# Patient Record
Sex: Female | Born: 1955 | Race: White | Hispanic: No | Marital: Married | Smoking: Never smoker
Health system: Southern US, Community
[De-identification: ages and names within clinical notes are randomized; demographics above are authoritative.]

## PROBLEM LIST (undated history)

## (undated) DIAGNOSIS — L509 Urticaria, unspecified: Secondary | ICD-10-CM

## (undated) HISTORY — PX: TUBAL LIGATION: SHX77

## (undated) HISTORY — DX: Urticaria, unspecified: L50.9

---

## 2013-12-10 DIAGNOSIS — E785 Hyperlipidemia, unspecified: Secondary | ICD-10-CM | POA: Insufficient documentation

## 2013-12-10 DIAGNOSIS — D126 Benign neoplasm of colon, unspecified: Secondary | ICD-10-CM | POA: Insufficient documentation

## 2013-12-10 DIAGNOSIS — B070 Plantar wart: Secondary | ICD-10-CM | POA: Insufficient documentation

## 2013-12-10 DIAGNOSIS — K824 Cholesterolosis of gallbladder: Secondary | ICD-10-CM | POA: Insufficient documentation

## 2013-12-10 DIAGNOSIS — M79641 Pain in right hand: Secondary | ICD-10-CM | POA: Insufficient documentation

## 2013-12-10 DIAGNOSIS — M779 Enthesopathy, unspecified: Secondary | ICD-10-CM | POA: Insufficient documentation

## 2013-12-10 DIAGNOSIS — R9389 Abnormal findings on diagnostic imaging of other specified body structures: Secondary | ICD-10-CM | POA: Insufficient documentation

## 2013-12-10 DIAGNOSIS — M47812 Spondylosis without myelopathy or radiculopathy, cervical region: Secondary | ICD-10-CM | POA: Insufficient documentation

## 2013-12-10 DIAGNOSIS — K219 Gastro-esophageal reflux disease without esophagitis: Secondary | ICD-10-CM | POA: Insufficient documentation

## 2013-12-10 DIAGNOSIS — R7309 Other abnormal glucose: Secondary | ICD-10-CM | POA: Insufficient documentation

## 2013-12-10 DIAGNOSIS — R739 Hyperglycemia, unspecified: Secondary | ICD-10-CM | POA: Insufficient documentation

## 2013-12-10 DIAGNOSIS — G4700 Insomnia, unspecified: Secondary | ICD-10-CM | POA: Insufficient documentation

## 2013-12-10 DIAGNOSIS — IMO0001 Reserved for inherently not codable concepts without codable children: Secondary | ICD-10-CM | POA: Insufficient documentation

## 2013-12-10 DIAGNOSIS — I10 Essential (primary) hypertension: Secondary | ICD-10-CM | POA: Insufficient documentation

## 2013-12-10 DIAGNOSIS — R202 Paresthesia of skin: Secondary | ICD-10-CM | POA: Insufficient documentation

## 2013-12-10 DIAGNOSIS — M79642 Pain in left hand: Secondary | ICD-10-CM

## 2013-12-10 DIAGNOSIS — B009 Herpesviral infection, unspecified: Secondary | ICD-10-CM | POA: Insufficient documentation

## 2013-12-10 DIAGNOSIS — N62 Hypertrophy of breast: Secondary | ICD-10-CM | POA: Insufficient documentation

## 2013-12-10 DIAGNOSIS — Z9889 Other specified postprocedural states: Secondary | ICD-10-CM | POA: Insufficient documentation

## 2013-12-10 DIAGNOSIS — M2020 Hallux rigidus, unspecified foot: Secondary | ICD-10-CM | POA: Insufficient documentation

## 2013-12-10 DIAGNOSIS — E559 Vitamin D deficiency, unspecified: Secondary | ICD-10-CM | POA: Insufficient documentation

## 2013-12-10 DIAGNOSIS — K589 Irritable bowel syndrome without diarrhea: Secondary | ICD-10-CM | POA: Insufficient documentation

## 2014-08-01 ENCOUNTER — Encounter: Payer: Self-pay | Admitting: Sports Medicine

## 2014-08-01 ENCOUNTER — Ambulatory Visit (INDEPENDENT_AMBULATORY_CARE_PROVIDER_SITE_OTHER): Payer: BLUE CROSS/BLUE SHIELD | Admitting: Sports Medicine

## 2014-08-01 ENCOUNTER — Ambulatory Visit (INDEPENDENT_AMBULATORY_CARE_PROVIDER_SITE_OTHER): Payer: BLUE CROSS/BLUE SHIELD

## 2014-08-01 VITALS — BP 120/73 | HR 65 | Ht 62.0 in | Wt 157.0 lb

## 2014-08-01 DIAGNOSIS — M2240 Chondromalacia patellae, unspecified knee: Secondary | ICD-10-CM | POA: Diagnosis not present

## 2014-08-01 DIAGNOSIS — K760 Fatty (change of) liver, not elsewhere classified: Secondary | ICD-10-CM | POA: Insufficient documentation

## 2014-08-01 DIAGNOSIS — M1990 Unspecified osteoarthritis, unspecified site: Secondary | ICD-10-CM | POA: Diagnosis not present

## 2014-08-01 DIAGNOSIS — M19041 Primary osteoarthritis, right hand: Secondary | ICD-10-CM

## 2014-08-01 DIAGNOSIS — M19042 Primary osteoarthritis, left hand: Secondary | ICD-10-CM

## 2014-08-01 DIAGNOSIS — M7732 Calcaneal spur, left foot: Secondary | ICD-10-CM

## 2014-08-01 DIAGNOSIS — M5136 Other intervertebral disc degeneration, lumbar region: Secondary | ICD-10-CM | POA: Diagnosis not present

## 2014-08-01 DIAGNOSIS — M7731 Calcaneal spur, right foot: Secondary | ICD-10-CM

## 2014-08-01 DIAGNOSIS — M19049 Primary osteoarthritis, unspecified hand: Secondary | ICD-10-CM | POA: Insufficient documentation

## 2014-08-01 DIAGNOSIS — M2021 Hallux rigidus, right foot: Secondary | ICD-10-CM | POA: Insufficient documentation

## 2014-08-01 DIAGNOSIS — M51369 Other intervertebral disc degeneration, lumbar region without mention of lumbar back pain or lower extremity pain: Secondary | ICD-10-CM | POA: Insufficient documentation

## 2014-08-01 MED ORDER — IBUPROFEN-FAMOTIDINE 800-26.6 MG PO TABS: 1.0000 | ORAL_TABLET | Freq: Three times a day (TID) | ORAL | Status: DC

## 2014-08-01 NOTE — Assessment & Plan Note (Signed)
X rays

## 2014-08-01 NOTE — Assessment & Plan Note (Signed)
With patellofemoral crepitus. Formal physical therapy, x-rays. Duexis.  Return in a month. Interventional treatment if no better.

## 2014-08-01 NOTE — Assessment & Plan Note (Signed)
Symptoms are suspicious for lumbar degenerative disc disease with pain on Valsalva. Pain is all axial, nothing radicular, and also worse with standing. At this point we are going to do ibuprofen/famotidine, and I would like her to do some formal physical therapy as well. At her return visit if no better we will consider an x-ray, and subsequently possibly an MRI for interventional planning.

## 2014-08-01 NOTE — Assessment & Plan Note (Signed)
With what appears to be fusion of the right first metatarsophalangeal joint, with mild limitus of the left first metatarsal-phalangeal joint. Custom orthotics as above.

## 2014-08-01 NOTE — Progress Notes (Signed)
   Subjective:    I'm seeing this patient as a consultation for:  Dr. Ruben Gottron  CC: Polyarthralgia  HPI: This is a pleasant 59 year old female who comes in with joint pains at multiple sites, she does desire custom orthotics.  Foot pain: Bilateral, localized in the arches, worse at the MTP, she has very little movement in the right first and moderate movement in the left first MTP. Symptoms are moderate, persistent.  Low back pain: Worse with sneezing, moderate, persistent without radiation, no bowel or bladder dysfunction or saddle numbness.  Bilateral hand pain: Localize at the thumb basal joint bilaterally. Has not been using NSAIDs due to epigastric pain.  Bilateral knee pain: Localize anteriorly, moderate, persistent without radiation, worse with squatting and going up and down stairs.  Past medical history, Surgical history, Family history not pertinant except as noted below, Social history, Allergies, and medications have been entered into the medical record, reviewed, and no changes needed.   Review of Systems: No headache, visual changes, nausea, vomiting, diarrhea, constipation, dizziness, abdominal pain, skin rash, fevers, chills, night sweats, weight loss, swollen lymph nodes, body aches, joint swelling, muscle aches, chest pain, shortness of breath, mood changes, visual or auditory hallucinations.   Objective:   General: Well Developed, well nourished, and in no acute distress.  Neuro/Psych: Alert and oriented x3, extra-ocular muscles intact, able to move all 4 extremities, sensation grossly intact. Skin: Warm and dry, no rashes noted.  Respiratory: Not using accessory muscles, speaking in full sentences, trachea midline.  Cardiovascular: Pulses palpable, no extremity edema. Abdomen: Does not appear distended. Bilateral Knee: Normal to inspection with no erythema or effusion or obvious bony abnormalities. Tender to palpation at the patellar facets, with painful patellar  compression. ROM normal in flexion and extension and lower leg rotation. Ligaments with solid consistent endpoints including ACL, PCL, LCL, MCL. Negative Mcmurray's and provocative meniscal tests. Bilateral feet: No visible erythema or swelling. Range of motion is full in all directions. Strength is 5/5 in all directions. No hallux valgus. No pes cavus or pes planus. No abnormal callus noted. No pain over the navicular prominence, or base of fifth metatarsal. No tenderness to palpation of the calcaneal insertion of plantar fascia. No pain at the Achilles insertion. No pain over the calcaneal bursa. No pain of the retrocalcaneal bursa. No tenderness to palpation over the tarsals, metatarsals, or phalanges. Right foot seems to be fused at the first metatarsophalangeal joint, left side is somewhat swollen but with good motion. No tenderness palpation over interphalangeal joints. No pain with compression of the metatarsal heads. Neurovascularly intact distally. Patellar and quadriceps tendons unremarkable. Hamstring and quadriceps strength is normal.  Patient was fitted for a : standard, cushioned, semi-rigid orthotic. The orthotic was heated and afterward the patient stood on the orthotic blank positioned on the orthotic stand. The patient was positioned in subtalar neutral position and 10 degrees of ankle dorsiflexion in a weight bearing stance. After completion of molding, a stable base was applied to the orthotic blank. The blank was ground to a stable position for weight bearing. Size: 6 Base: [employer] safety [employer] and Padding: None The patient ambulated these, and they were very comfortable.  Impression and Recommendations:   This case required medical decision making of moderate complexity.

## 2014-08-30 ENCOUNTER — Ambulatory Visit: Payer: BLUE CROSS/BLUE SHIELD | Admitting: Sports Medicine

## 2014-09-05 ENCOUNTER — Ambulatory Visit (INDEPENDENT_AMBULATORY_CARE_PROVIDER_SITE_OTHER): Payer: BLUE CROSS/BLUE SHIELD | Admitting: Sports Medicine

## 2014-09-05 ENCOUNTER — Encounter: Payer: Self-pay | Admitting: Sports Medicine

## 2014-09-05 VITALS — BP 127/81 | HR 66 | Wt 156.0 lb

## 2014-09-05 DIAGNOSIS — M5136 Other intervertebral disc degeneration, lumbar region: Secondary | ICD-10-CM | POA: Diagnosis not present

## 2014-09-05 DIAGNOSIS — M2241 Chondromalacia patellae, right knee: Secondary | ICD-10-CM | POA: Diagnosis not present

## 2014-09-05 DIAGNOSIS — M51369 Other intervertebral disc degeneration, lumbar region without mention of lumbar back pain or lower extremity pain: Secondary | ICD-10-CM

## 2014-09-05 NOTE — Assessment & Plan Note (Signed)
Resolved with formal physical therapy.

## 2014-09-05 NOTE — Assessment & Plan Note (Signed)
Injection as above, transition to patellar stabilizing orthosis. Continue therapy, has only had 2 sessions. Return in one month.

## 2014-09-05 NOTE — Progress Notes (Signed)
  Subjective:    CC: Follow-up  HPI: Lumbar degenerative disc disease with spondylolisthesis: Resolved with physical therapy.  Patellofemoral chondromalacia: Right-sided, persistent despite therapy, pain is moderate, persistent localized under the kneecap.  Past medical history, Surgical history, Family history not pertinant except as noted below, Social history, Allergies, and medications have been entered into the medical record, reviewed, and no changes needed.   Review of Systems: No fevers, chills, night sweats, weight loss, chest pain, or shortness of breath.   Objective:    General: Well Developed, well nourished, and in no acute distress.  Neuro: Alert and oriented x3, extra-ocular muscles intact, sensation grossly intact.  HEENT: Normocephalic, atraumatic, pupils equal round reactive to light, neck supple, no masses, no lymphadenopathy, thyroid nonpalpable.  Skin: Warm and dry, no rashes. Cardiac: Regular rate and rhythm, no murmurs rubs or gallops, no lower extremity edema.  Respiratory: Clear to auscultation bilaterally. Not using accessory muscles, speaking in full sentences. Right Knee: Normal to inspection with no erythema or effusion or obvious bony abnormalities. Palpation normal with no warmth or joint line tenderness or patellar tenderness or condyle tenderness. ROM normal in flexion and extension and lower leg rotation. Ligaments with solid consistent endpoints including ACL, PCL, LCL, MCL. Negative Mcmurray's and provocative meniscal tests. Painful patellar compression. Positive apprehension sign, patellar. Patellar and quadriceps tendons unremarkable. Hamstring and quadriceps strength is normal.  Procedure: Real-time Ultrasound Guided Injection of right knee Device: GE Logiq E  Verbal informed consent obtained.  Time-out conducted.  Noted no overlying erythema, induration, or other signs of local infection.  Skin prepped in a sterile fashion.  Local  anesthesia: Topical Ethyl chloride.  With sterile technique and under real time ultrasound guidance:  2 mL Kenalog 40, 4 mL lidocaine injected easily. Completed without difficulty  Pain immediately resolved suggesting accurate placement of the medication.  Advised to call if fevers/chills, erythema, induration, drainage, or persistent bleeding.  Images permanently stored and available for review in the ultrasound unit.  Impression: Technically successful ultrasound guided injection.  Impression and Recommendations:

## 2014-09-28 DIAGNOSIS — M545 Low back pain, unspecified: Secondary | ICD-10-CM | POA: Insufficient documentation

## 2014-09-28 DIAGNOSIS — Z79899 Other long term (current) drug therapy: Secondary | ICD-10-CM | POA: Insufficient documentation

## 2014-09-28 DIAGNOSIS — B372 Candidiasis of skin and nail: Secondary | ICD-10-CM | POA: Insufficient documentation

## 2014-09-28 DIAGNOSIS — Z0000 Encounter for general adult medical examination without abnormal findings: Secondary | ICD-10-CM | POA: Insufficient documentation

## 2014-10-04 ENCOUNTER — Ambulatory Visit (INDEPENDENT_AMBULATORY_CARE_PROVIDER_SITE_OTHER): Payer: BLUE CROSS/BLUE SHIELD | Admitting: Sports Medicine

## 2014-10-04 ENCOUNTER — Encounter: Payer: Self-pay | Admitting: Sports Medicine

## 2014-10-04 VITALS — BP 117/77 | HR 76 | Ht 62.0 in | Wt 155.0 lb

## 2014-10-04 DIAGNOSIS — M2241 Chondromalacia patellae, right knee: Secondary | ICD-10-CM

## 2014-10-04 NOTE — Assessment & Plan Note (Signed)
Doing well with regards to patellofemoral chondromalacia after injection. Will continue home rehabilitation exercises. I did plan out the medial aspect of the right orthotic, there was some excessive supination. Gait is neutral after modification. Return to see me in an as-needed basis, we will touch base on mychart with regards to the small modification in the orthotic. Jennifer Bryan will try the orthotics in her other shoes which seem to be more of a motion control shoe.

## 2014-10-04 NOTE — Progress Notes (Signed)
  Subjective:    CC: Follow-up  HPI: Jennifer Bryan returns, she is doing very well with regards to her right patellofemoral chondromalacia, she is doing well with the orthotics however does feel as though she is excessively supinating. Symptoms are mild. No mechanical symptoms.  Past medical history, Surgical history, Family history not pertinant except as noted below, Social history, Allergies, and medications have been entered into the medical record, reviewed, and no changes needed.   Review of Systems: No fevers, chills, night sweats, weight loss, chest pain, or shortness of breath.   Objective:    General: Well Developed, well nourished, and in no acute distress.  Neuro: Alert and oriented x3, extra-ocular muscles intact, sensation grossly intact.  HEENT: Normocephalic, atraumatic, pupils equal round reactive to light, neck supple, no masses, no lymphadenopathy, thyroid nonpalpable.  Skin: Warm and dry, no rashes. Cardiac: Regular rate and rhythm, no murmurs rubs or gallops, no lower extremity edema.  Respiratory: Clear to auscultation bilaterally. Not using accessory muscles, speaking in full sentences.  During gait analysis it didn't appear as though she remained excessive supination in the right foot. I planed out the medial aspect of her right orthotic that improved her motion.  Impression and Recommendations:

## 2015-07-31 IMAGING — DX DG LUMBAR SPINE COMPLETE 4+V
5 series · 5 of 5 positions shown · non-contrast
Comparison: None.

CLINICAL DATA: Chronic mid and lower back pain without known injury

EXAM:
LUMBAR SPINE - COMPLETE 4+ VIEW

[l-spine ap]
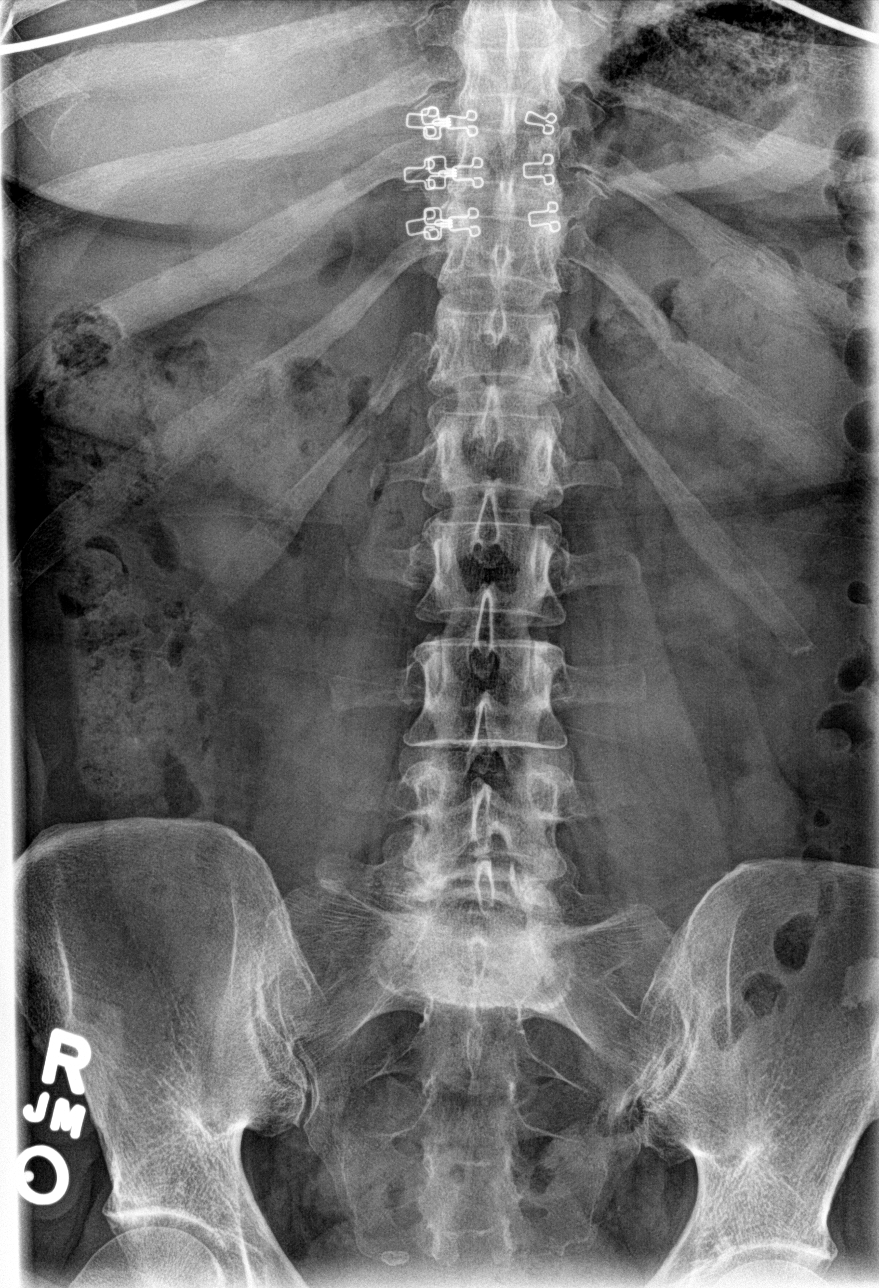

[l-spine obl (1 of 2)]
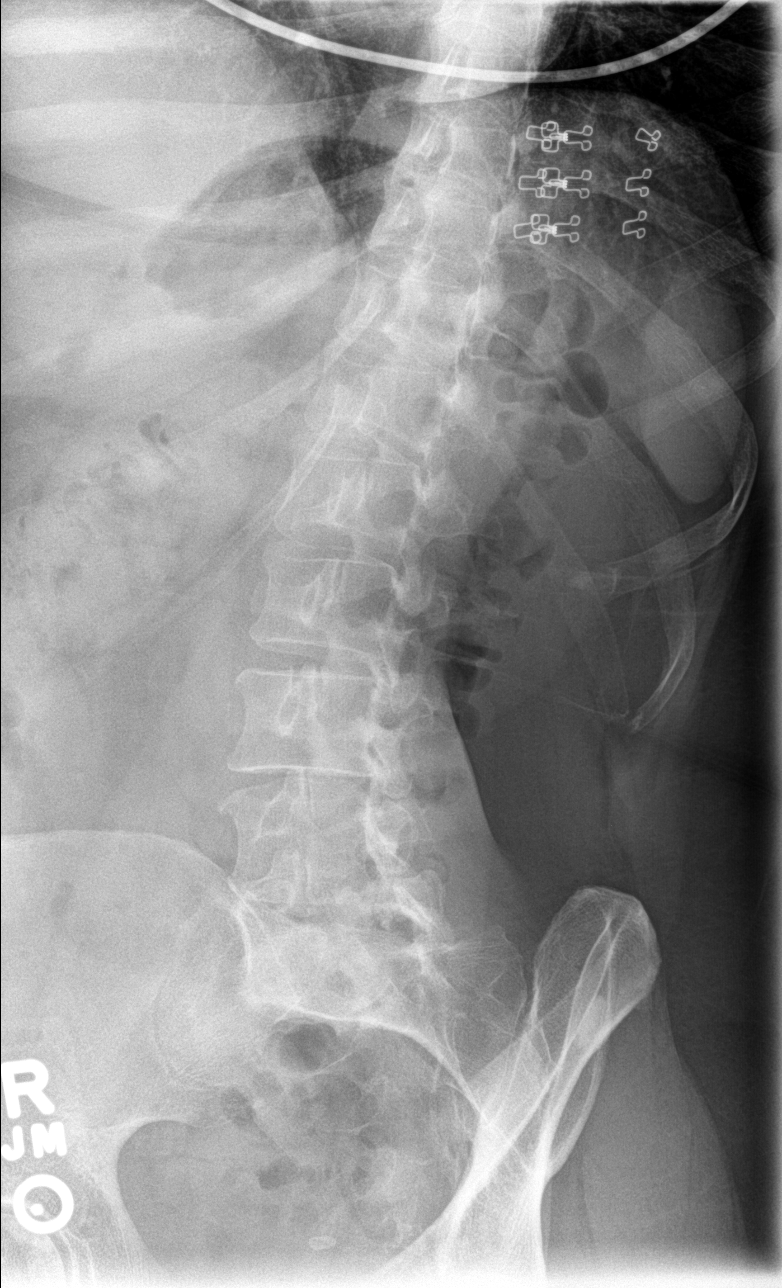

[l-spine obl (2 of 2)]
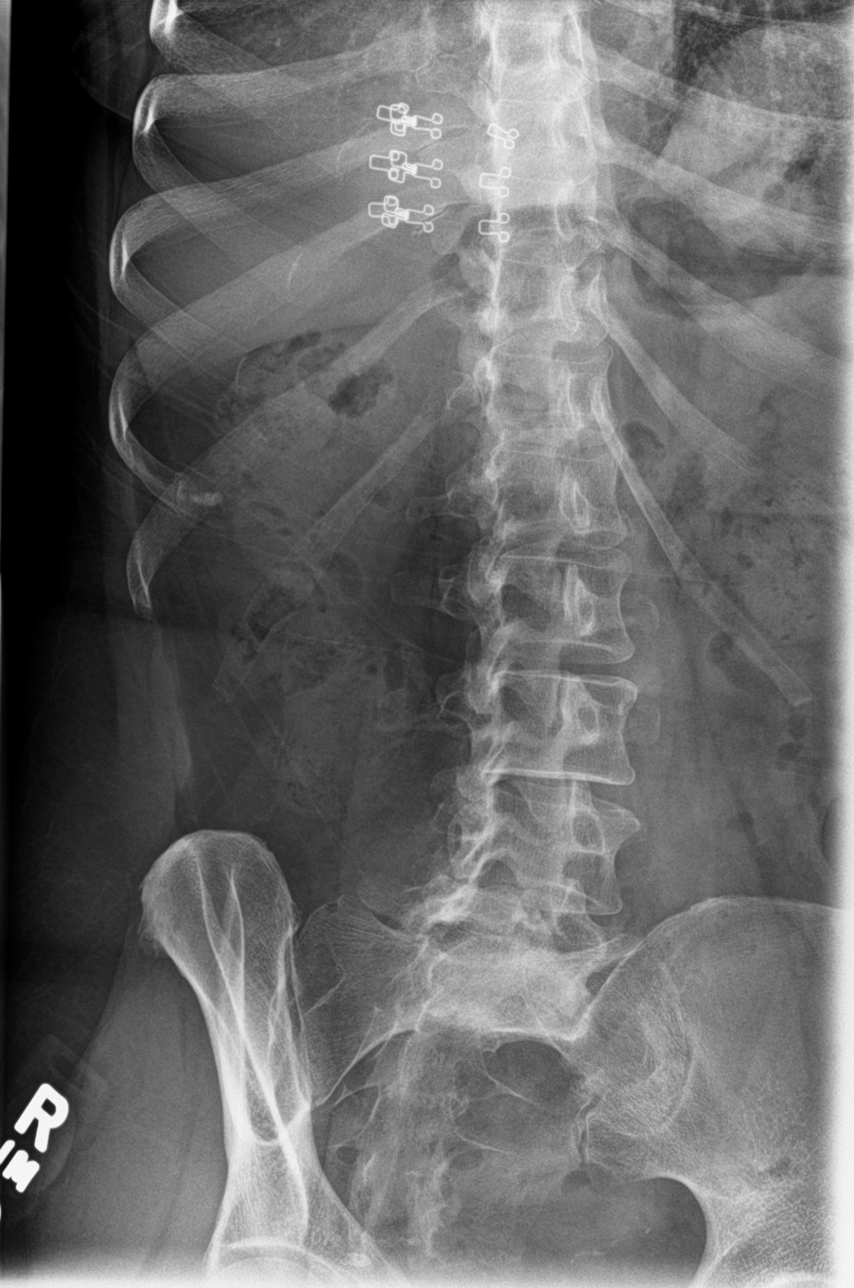

[l-spine lat]
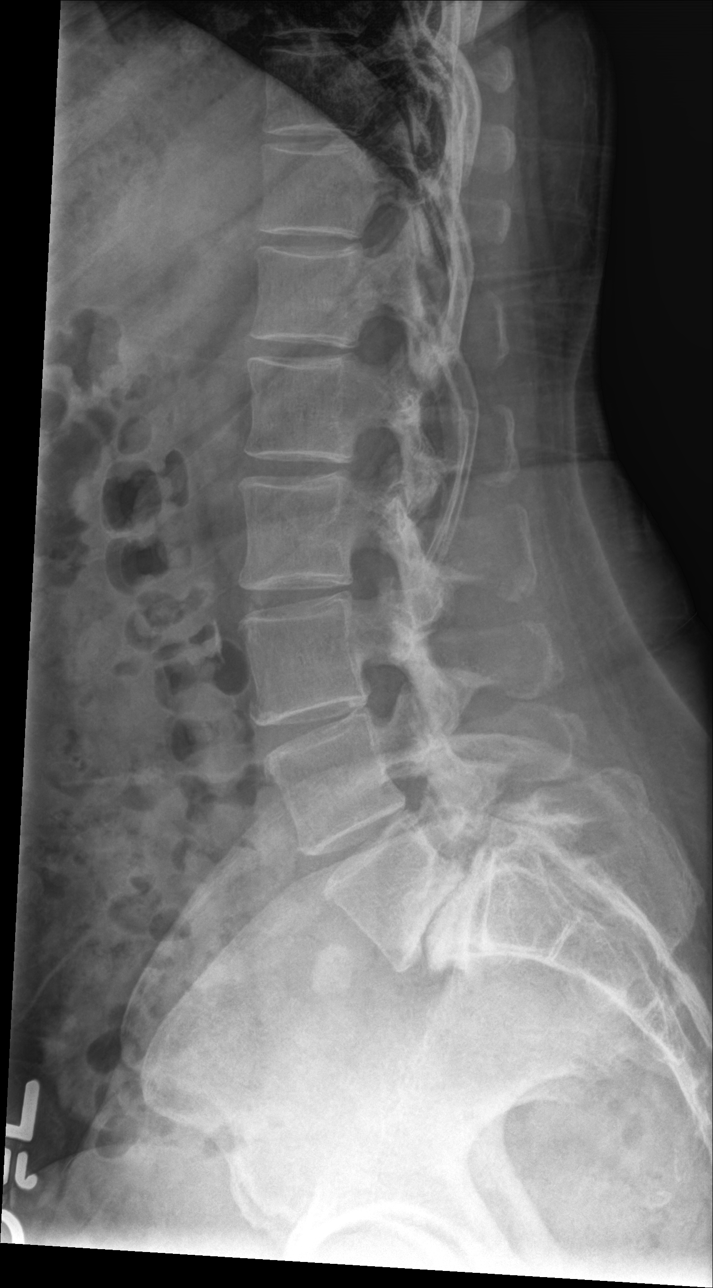

[l-spine lat l5-s1]
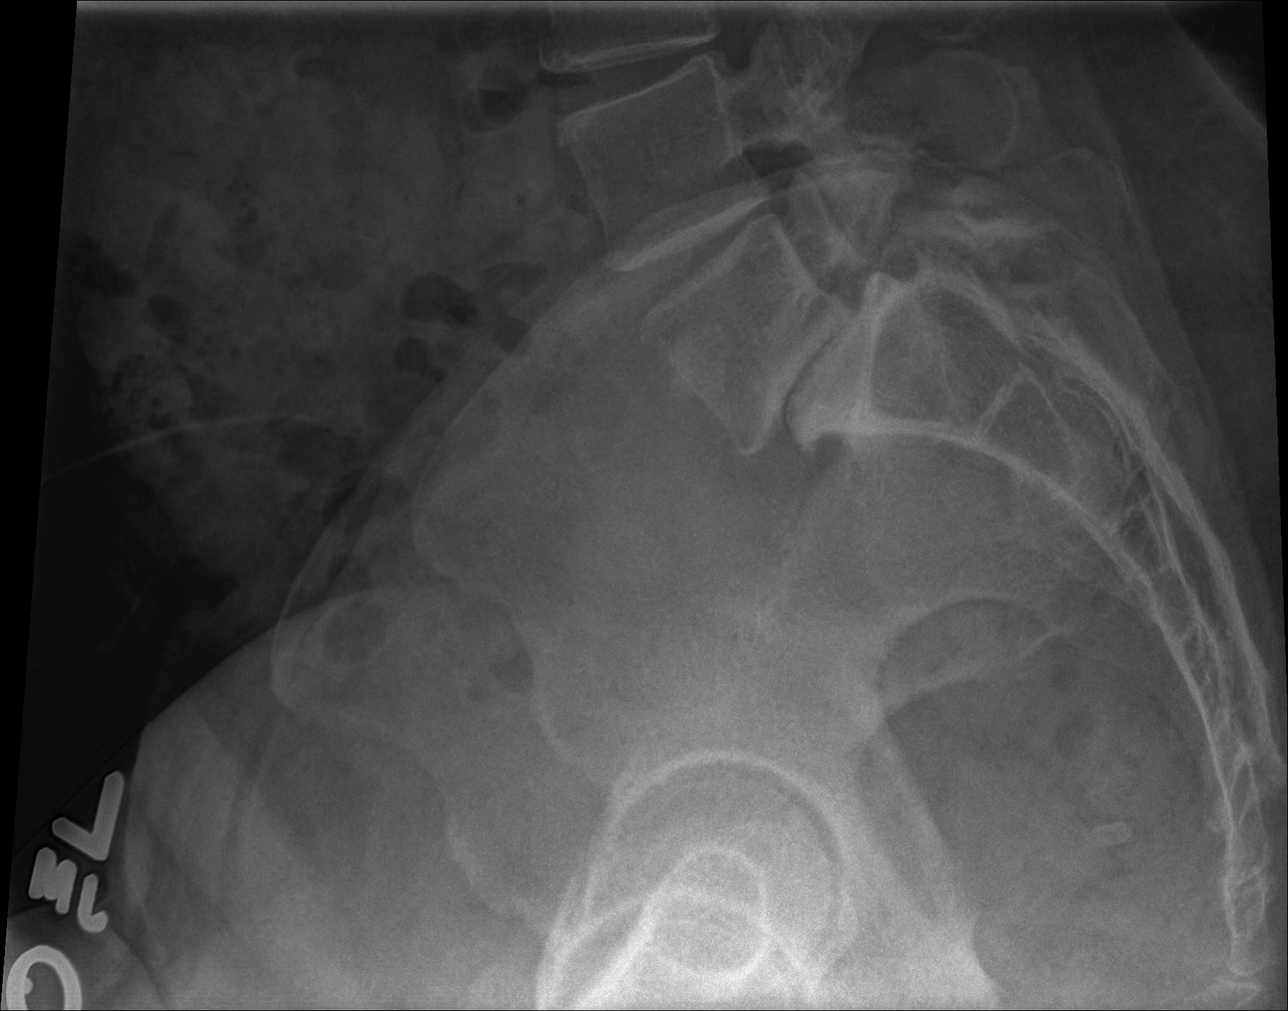

[5 of 5 positions shown; findings below may reference images not displayed]

FINDINGS: The lumbar vertebral bodies are preserved in height. There is grade
1 anterolisthesis of L5 with respect S1 which appears to be on the
basis of degenerative disc disease and probable bilateral pars
defects. There is mild disc space narrowing at L4-5 with moderate
narrowing at L5-S1. The pedicles and transverse processes are
intact. The observed portions of the sacrum are normal.
IMPRESSION: Grade 1 anterolisthesis of L5 with respect S1 is likely secondary to
bilateral pars defects as well moderate to severe degenerative disc
change. There is milder degenerative disc disease at L4-5. There is
no compression fracture.

## 2015-10-05 DIAGNOSIS — K635 Polyp of colon: Secondary | ICD-10-CM | POA: Insufficient documentation

## 2015-10-05 DIAGNOSIS — Z1159 Encounter for screening for other viral diseases: Secondary | ICD-10-CM | POA: Insufficient documentation

## 2016-06-10 DIAGNOSIS — J690 Pneumonitis due to inhalation of food and vomit: Secondary | ICD-10-CM | POA: Insufficient documentation

## 2016-07-03 DIAGNOSIS — R1313 Dysphagia, pharyngeal phase: Secondary | ICD-10-CM | POA: Insufficient documentation

## 2016-08-29 ENCOUNTER — Ambulatory Visit (INDEPENDENT_AMBULATORY_CARE_PROVIDER_SITE_OTHER): Payer: BLUE CROSS/BLUE SHIELD | Admitting: Sports Medicine

## 2016-08-29 ENCOUNTER — Ambulatory Visit (INDEPENDENT_AMBULATORY_CARE_PROVIDER_SITE_OTHER): Payer: BLUE CROSS/BLUE SHIELD

## 2016-08-29 DIAGNOSIS — M19072 Primary osteoarthritis, left ankle and foot: Secondary | ICD-10-CM | POA: Diagnosis not present

## 2016-08-29 DIAGNOSIS — M19071 Primary osteoarthritis, right ankle and foot: Secondary | ICD-10-CM | POA: Diagnosis not present

## 2016-08-29 DIAGNOSIS — M79672 Pain in left foot: Secondary | ICD-10-CM

## 2016-08-29 MED ORDER — ACETAMINOPHEN ER 650 MG PO TBCR: 650.0000 mg | EXTENDED_RELEASE_TABLET | Freq: Three times a day (TID) | ORAL | 3 refills | Status: DC | PRN

## 2016-08-29 NOTE — Progress Notes (Signed)
   Subjective:    I'm seeing this patient as a consultation for:  Dr. Rolan Lipa  CC: Left foot pain  HPI: For a couple of months this pleasant 60 year old female has had pain that she localizes medially along the arch of her left foot, worse with prolonged weightbearing. Pain is moderate, persistent without radiation. She has known osteoarthritis at the MTPs, overall this is doing well. She is developing some hallux valgus in addition to hallux rigidus, and this is causing some callus formation between the first and second toes. Unfortunately she lost the custom orthotics that I made her years ago.  Past medical history:  Negative.  See flowsheet/record as well for more information.  Surgical history: Negative.  See flowsheet/record as well for more information.  Family history: Negative.  See flowsheet/record as well for more information.  Social history: Negative.  See flowsheet/record as well for more information.  Allergies, and medications have been entered into the medical record, reviewed, and no changes needed.   Review of Systems: No headache, visual changes, nausea, vomiting, diarrhea, constipation, dizziness, abdominal pain, skin rash, fevers, chills, night sweats, weight loss, swollen lymph nodes, body aches, joint swelling, muscle aches, chest pain, shortness of breath, mood changes, visual or auditory hallucinations.   Objective:   General: Well Developed, well nourished, and in no acute distress.  Neuro/Psych: Alert and oriented x3, extra-ocular muscles intact, able to move all 4 extremities, sensation grossly intact. Skin: Warm and dry, no rashes noted.  Respiratory: Not using accessory muscles, speaking in full sentences, trachea midline.  Cardiovascular: Pulses palpable, no extremity edema. Abdomen: Does not appear distended. Left Foot: No visible erythema or swelling. Range of motion is full in all directions. Strength is 5/5 in all directions. No hallux  valgus. No pes cavus or pes planus. No abnormal callus noted. No pain over the navicular prominence, or base of fifth metatarsal. No tenderness to palpation of the calcaneal insertion of plantar fascia. No pain at the Achilles insertion. No pain over the calcaneal bursa. No pain of the retrocalcaneal bursa. Tender to palpation over the navicular, no tenderness over the tibialis posterior, this is consistent with the navicular stress injury. No hallux rigidus or limitus. No tenderness palpation over interphalangeal joints. No pain with compression of the metatarsal heads. Neurovascularly intact distally.  Impression and Recommendations:   This case required medical decision making of moderate complexity.  Left foot pain Is likely related to a navicular stress injury. Getting updated x-rays, added a scaphoid pad. She is unable to find her orthotics so she will come back for a new set of custom orthotics. Switching to Tylenol, she is getting some gastritis.

## 2016-08-29 NOTE — Assessment & Plan Note (Signed)
Is likely related to a navicular stress injury. Getting updated x-rays, added a scaphoid pad. She is unable to find her orthotics so she will come back for a new set of custom orthotics. Switching to Tylenol, she is getting some gastritis.

## 2016-09-03 ENCOUNTER — Ambulatory Visit (INDEPENDENT_AMBULATORY_CARE_PROVIDER_SITE_OTHER): Payer: BLUE CROSS/BLUE SHIELD | Admitting: Sports Medicine

## 2016-09-03 DIAGNOSIS — M79672 Pain in left foot: Secondary | ICD-10-CM | POA: Diagnosis not present

## 2016-09-03 NOTE — Assessment & Plan Note (Signed)
Navicular stress injury is improving, custom orthotics as above.

## 2016-09-03 NOTE — Progress Notes (Signed)

## 2017-08-01 DIAGNOSIS — Z86010 Personal history of colonic polyps: Secondary | ICD-10-CM | POA: Insufficient documentation

## 2017-08-28 IMAGING — DX DG FOOT COMPLETE 3+V*L*
3 series · 3 of 3 positions shown · non-contrast
Comparison: 08/01/2014.

CLINICAL DATA: Pain.  History of arthritis.

EXAM:
LEFT FOOT - COMPLETE 3+ VIEW

[foot ap]
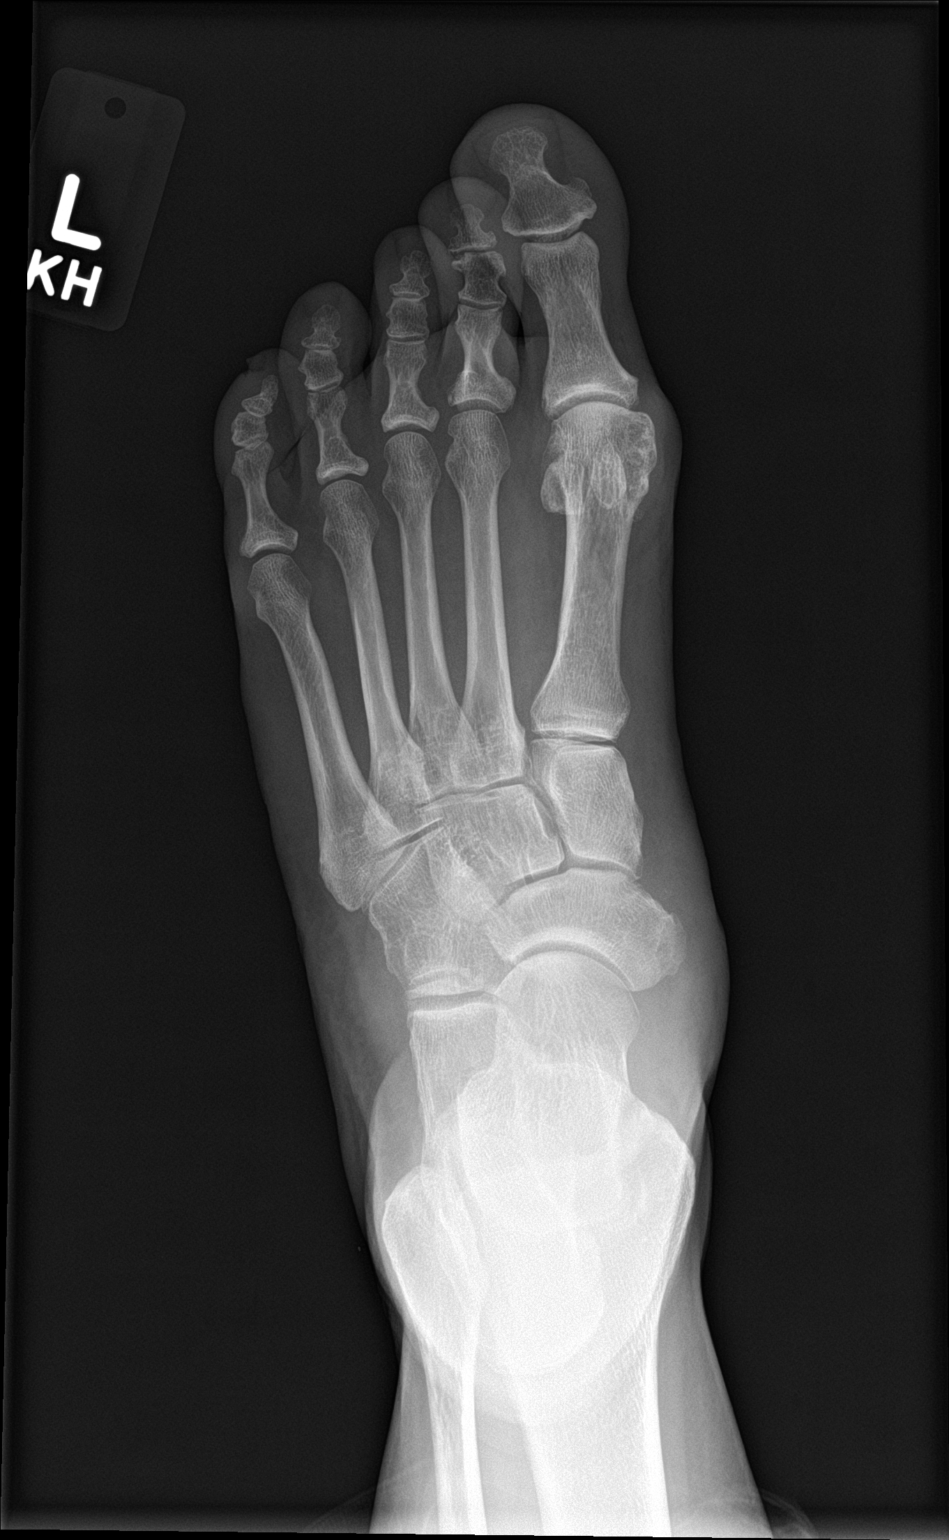

[foot obl]
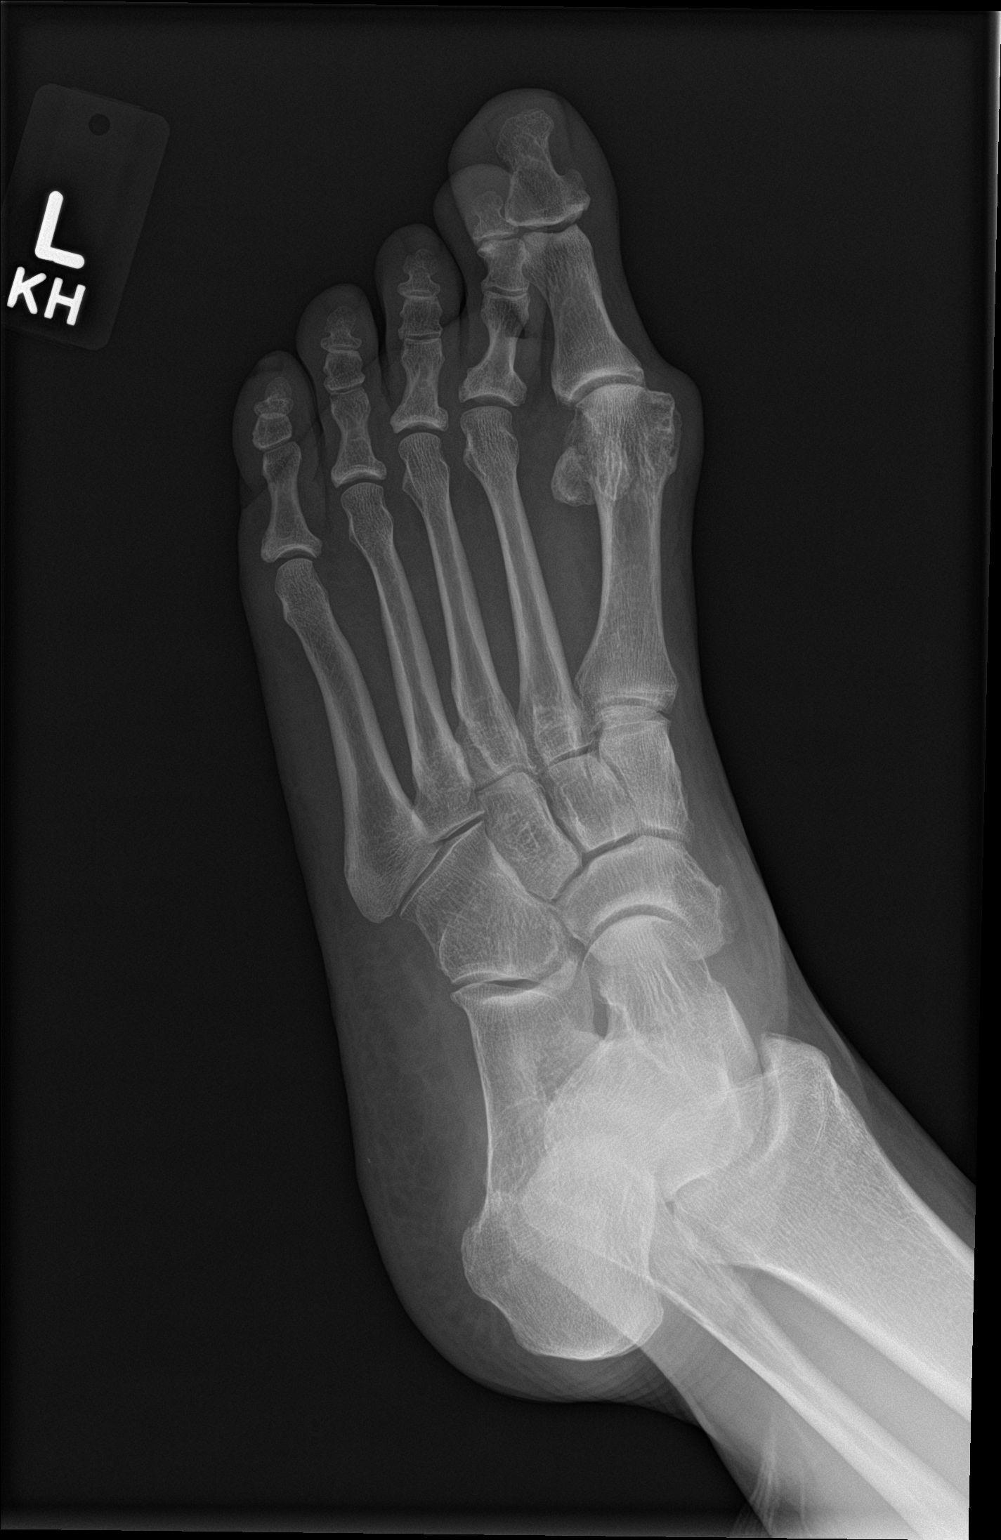

[foot lat]
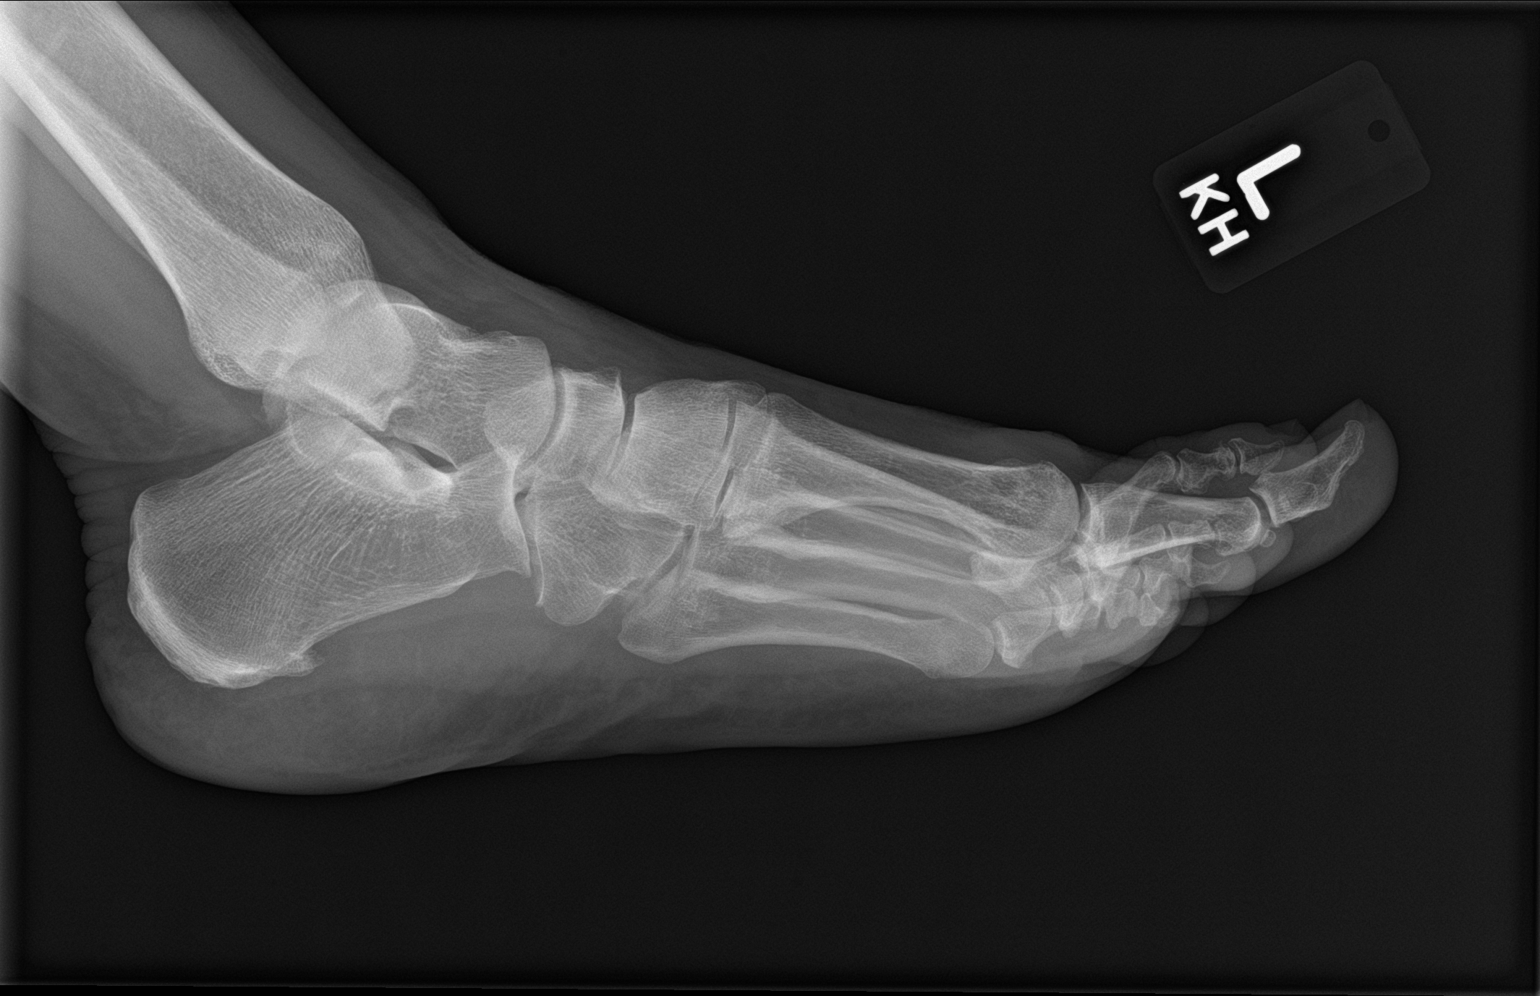

[3 of 3 positions shown; findings below may reference images not displayed]

FINDINGS: Diffuse degenerative change. Degenerative changes most prominent
first MTP joint. No evidence of fracture or dislocation. Diffuse
soft tissue swelling.
IMPRESSION: 1. Diffuse soft tissue swelling. 2. Diffuse degenerative change .
Degenerative changes most prominent the first MTP joint. No acute
bony abnormality.

## 2018-01-30 DIAGNOSIS — R768 Other specified abnormal immunological findings in serum: Secondary | ICD-10-CM | POA: Insufficient documentation

## 2018-03-04 ENCOUNTER — Encounter: Payer: Self-pay | Admitting: Allergy and Immunology

## 2018-03-04 ENCOUNTER — Ambulatory Visit (INDEPENDENT_AMBULATORY_CARE_PROVIDER_SITE_OTHER): Payer: BLUE CROSS/BLUE SHIELD | Admitting: Allergy and Immunology

## 2018-03-04 VITALS — BP 124/74 | HR 64 | Temp 97.4°F | Resp 16 | Ht 61.6 in | Wt 131.0 lb

## 2018-03-04 DIAGNOSIS — L858 Other specified epidermal thickening: Secondary | ICD-10-CM | POA: Insufficient documentation

## 2018-03-04 DIAGNOSIS — K2970 Gastritis, unspecified, without bleeding: Secondary | ICD-10-CM

## 2018-03-04 DIAGNOSIS — J3089 Other allergic rhinitis: Secondary | ICD-10-CM | POA: Diagnosis not present

## 2018-03-04 DIAGNOSIS — L500 Allergic urticaria: Secondary | ICD-10-CM | POA: Insufficient documentation

## 2018-03-04 MED ORDER — AMMONIUM LACTATE 12 % EX LOTN: TOPICAL_LOTION | CUTANEOUS | 5 refills | Status: AC

## 2018-03-04 MED ORDER — FLUTICASONE PROPIONATE 50 MCG/ACT NA SUSP: NASAL | 5 refills | Status: AC

## 2018-03-04 MED ORDER — MONTELUKAST SODIUM 10 MG PO TABS: 10.0000 mg | ORAL_TABLET | Freq: Every day | ORAL | 5 refills | Status: DC

## 2018-03-04 MED ORDER — LEVOCETIRIZINE DIHYDROCHLORIDE 5 MG PO TABS: 5.0000 mg | ORAL_TABLET | Freq: Every evening | ORAL | 5 refills | Status: DC

## 2018-03-04 NOTE — Assessment & Plan Note (Signed)
The patient's history and physical exam suggest keratosis pilaris. Reassurance has been provided that keratosis pilaris does not have long-term health implications, occurs in otherwise healthy people, and treatment usually isn't necessary. Keratosis pilaris may become inflamed with exercise, heat, or emotion.   Information regarding keratosis pilaris was discussed, questions were answered and written information was provided.  A prescription has been provided for  ammonium lactate 12% lotion applied to affected areas twice a day as needed.

## 2018-03-04 NOTE — Assessment & Plan Note (Addendum)
Unclear etiology. Skin tests to select food allergens were negative today. NSAIDs and emotional stress commonly exacerbate urticaria but are not the underlying etiology in this case. Physical urticarias are negative by history (i.e. pressure-induced, temperature, vibration, solar, etc.). History and lesions are not consistent with urticaria pigmentosa so I am not suspicious for mastocytosis. There are no concomitant symptoms concerning for anaphylaxis or constitutional symptoms worrisome for an underlying malignancy. We will rule out other potential etiologies with labs. For symptom relief, patient is to take oral antihistamines as directed.  The following labs have been ordered: FCeRI antibody, anti-thyroglobulin antibody, thyroid peroxidase antibody, tryptase, urea breath test, CBC, and galactose-alpha-1,3-galactose IgE level.  The patient will be called with further recommendations after lab results have returned.  A prescription has been provided for levocetirizine, 5 mg daily as needed.  A prescription has been provided for montelukast 10 mg daily at bedtime.  Should there be a significant increase or change in symptoms, a journal is to be kept recording any foods eaten, beverages consumed, medications taken within a 6 hour period prior to the onset of symptoms, as well as record activities being performed, and environmental conditions. For any symptoms concerning for anaphylaxis, 911 is to be called immediately.

## 2018-03-04 NOTE — Assessment & Plan Note (Signed)
   Aeroallergen avoidance measures have been discussed and provided in written form.  Montelukast and levocetirizine have been prescribed (as above).  A prescription has been provided for fluticasone nasal spray, one spray per nostril daily as needed. Proper nasal spray technique has been discussed and demonstrated.  Nasal saline spray (i.e. Simply Saline) is recommended prior to medicated nasal sprays and as needed.

## 2018-03-04 NOTE — Patient Instructions (Addendum)
Recurrent urticaria Unclear etiology. Skin tests to select food allergens were negative today. NSAIDs and emotional stress commonly exacerbate urticaria but are not the underlying etiology in this case. Physical urticarias are negative by history (i.e. pressure-induced, temperature, vibration, solar, etc.). History and lesions are not consistent with urticaria pigmentosa so I am not suspicious for mastocytosis. There are no concomitant symptoms concerning for anaphylaxis or constitutional symptoms worrisome for an underlying malignancy. We will rule out other potential etiologies with labs. For symptom relief, patient is to take oral antihistamines as directed.  The following labs have been ordered: FCeRI antibody, anti-thyroglobulin antibody, thyroid peroxidase antibody, tryptase, urea breath test, CBC, and galactose-alpha-1,3-galactose IgE level.  The patient will be called with further recommendations after lab results have returned.  A prescription has been provided for levocetirizine, 5 mg daily as needed.  A prescription has been provided for montelukast 10 mg daily at bedtime.  Should there be a significant increase or change in symptoms, a journal is to be kept recording any foods eaten, beverages consumed, medications taken within a 6 hour period prior to the onset of symptoms, as well as record activities being performed, and environmental conditions. For any symptoms concerning for anaphylaxis, 911 is to be called immediately.  Keratosis pilaris The patient's history and physical exam suggest keratosis pilaris. Reassurance has been provided that keratosis pilaris does not have long-term health implications, occurs in otherwise healthy people, and treatment usually isn't necessary. Keratosis pilaris may become inflamed with exercise, heat, or emotion.   Information regarding keratosis pilaris was discussed, questions were answered and written information was provided.  A prescription has  been provided for  ammonium lactate 12% lotion applied to affected areas twice a day as needed.  Allergic rhinitis  Aeroallergen avoidance measures have been discussed and provided in written form.  Montelukast and levocetirizine have been prescribed (as above).  A prescription has been provided for fluticasone nasal spray, one spray per nostril daily as needed. Proper nasal spray technique has been discussed and demonstrated.  Nasal saline spray (i.e. Simply Saline) is recommended prior to medicated nasal sprays and as needed.   When lab results have returned the patient will be called with further recommendations and follow up instructions.    Keratosis pilaris  Signs and symptoms Keratosis pilaris is a harmless skin disorder that causes small, acne-like bumps. Although it isn't serious, keratosis pilaris can be frustrating because it's difficult to treat.  Keratosis pilaris results from a buildup of protein called keratin in the openings of hair follicles in the skin. This produces small, rough patches, usually on the arms and thighs, and can give skin a goose flesh or sandpaper appearance.   They usually don't hurt or itch. Typically, patches are skin colored, but they can, at times, be red and inflamed. Keratosis pilaris can also appear on the face, where it closely resembles acne. The small size of the bumps and its association with dry, chapped skin distinguish keratosis pilaris from pustular acne. Unlike elsewhere on the body, keratosis pilaris on the face may leave small scars. Though quite common with young children, keratosis pilaris can occur at any age.  It may improve, especially during the summer months, only to later worsen. Dry skin tends to worsen the condition.  Gradually, keratosis pilaris resolves on its own.  Many people are bothered by the goose flesh appearance of keratosis pilaris, but it doesn't have long-term health implications and occurs in otherwise healthy people.   Keratosis pilaris isn't a  serious medical condition, and treatment usually isn't necessary.  Treatment No single treatment universally improves keratosis pilaris. But most options, including self-care measures and medicated creams, focus on softening the keratin deposits in the skin.  Self-care Although self-help measures won't cure keratosis pilaris, they may help improve the appearance of your skin. You may find these measures beneficial: . Be gentle when washing your skin. Vigorous scrubbing or removal of the plugs may only irritate your skin and aggravate the condition.  . After washing or bathing, gently pat or blot your skin dry with a towel so that some moisture remains on the skin.  Marland Kitchen Apply the moisturizing lotion or lubricating cream while your skin is still moist from bathing. Choose a moisturizer that contains urea or propylene glycol, chemicals that soften dry, rough skin.  Marland Kitchen Apply an over-the-counter product that contains lactic acid twice daily (ie, Lac-Hydrin 12% lotion). Lactic acid helps remove extra keratin from the surface of the skin.  . Use a humidifier to add moisture to the air inside your home. Low humidity dries out your skin.    Control of Cockroach Allergen  Cockroach allergen has been identified as an important cause of acute attacks of asthma, especially in urban settings.  There are fifty-five species of cockroach that exist in the [place], however only three, the [place], [place] species produce allergen that can affect patients with Asthma.  Allergens can be obtained from fecal particles, egg casings and secretions from cockroaches.    1. Remove food sources. 2. Reduce access to water. 3. Seal access and entry points. 4. Spray runways with 0.5-1% Diazinon or Chlorpyrifos 5. Blow boric acid power under stoves and refrigerator. 6. Place bait stations (hydramethylnon) at feeding sites.

## 2018-03-04 NOTE — Progress Notes (Signed)
New Patient Note  RE: Jennifer Bryan MRN: [id number] DOB: [birth year] Date of Office Visit: 03/04/2018  Referring provider: Robyne Peers, MD Primary care provider: Robyne Peers, MD  Chief Complaint: Urticaria; Pruritus; and Rash   History of present illness: Jennifer Bryan is a 62 y.o. female seen today in consultation requested by Rolan Lipa, MD.  Over the past 1 year, Jennifer Bryan has experienced recurrent episodes of hives. The hives have appeared at different times over her head, face, chest and legs.  The lesions are described as erythematous, raised, and pruritic.  Individual hives last less than 24 hours without leaving residual pigmentation or bruising. She denies concomitant angioedema, cardiopulmonary symptoms and GI symptoms. She has not experienced unexpected weight loss, recurrent fevers or drenching night sweats. No specific medication, food, skin care product, detergent, soap, or other environmental triggers have been identified. The symptoms do not seem to correlate with NSAIDs use or emotional stress. She did not have symptoms consistent with a respiratory tract infection at the time of symptom onset. Jennifer Bryan has tried to control symptoms with OTC antihistamines which have offered adequate temporary relief. She has not been evaluated and treated in the emergency department for these symptoms. Skin biopsy has not been performed. Recently, she has been experiencing hives a few times a week.  She was seen by dermatologist physician's assistant who gave her tar shampoo, without benefit, for presumed diagnosis of seborrheic dermatitis.  She has had elevated ANA over the past year and is undergoing work-up with a rheumatologist for this as well as osteoarthritis.  She had been experiencing chronic diarrhea and sought the help of a nutritionist.  The nutritionist ordered a serum IgG food panel revealing multiple borderline positive results.  The patient removed these foods from her  diet and the diarrhea did seem to improve somewhat, however there is no change in her cutaneous symptoms. She also complains of small, rough, flesh-colored bumps on her upper arms which may occasionally be associated with mild pruritus. She experiences occasional nasal congestion, rhinorrhea, and postnasal drainage.  No significant seasonal symptom variation has been noted nor have specific environmental triggers been identified.  Assessment and plan: Recurrent urticaria Unclear etiology. Skin tests to select food allergens were negative today. NSAIDs and emotional stress commonly exacerbate urticaria but are not the underlying etiology in this case. Physical urticarias are negative by history (i.e. pressure-induced, temperature, vibration, solar, etc.). History and lesions are not consistent with urticaria pigmentosa so I am not suspicious for mastocytosis. There are no concomitant symptoms concerning for anaphylaxis or constitutional symptoms worrisome for an underlying malignancy. We will rule out other potential etiologies with labs. For symptom relief, patient is to take oral antihistamines as directed.  The following labs have been ordered: FCeRI antibody, anti-thyroglobulin antibody, thyroid peroxidase antibody, tryptase, urea breath test, CBC, and galactose-alpha-1,3-galactose IgE level.  The patient will be called with further recommendations after lab results have returned.  A prescription has been provided for levocetirizine, 5 mg daily as needed.  A prescription has been provided for montelukast 10 mg daily at bedtime.  Should there be a significant increase or change in symptoms, a journal is to be kept recording any foods eaten, beverages consumed, medications taken within a 6 hour period prior to the onset of symptoms, as well as record activities being performed, and environmental conditions. For any symptoms concerning for anaphylaxis, 911 is to be called immediately.  Keratosis  pilaris The patient's history and physical exam suggest keratosis  pilaris. Reassurance has been provided that keratosis pilaris does not have long-term health implications, occurs in otherwise healthy people, and treatment usually isn't necessary. Keratosis pilaris may become inflamed with exercise, heat, or emotion.   Information regarding keratosis pilaris was discussed, questions were answered and written information was provided.  A prescription has been provided for  ammonium lactate 12% lotion applied to affected areas twice a day as needed.  Allergic rhinitis  Aeroallergen avoidance measures have been discussed and provided in written form.  Montelukast and levocetirizine have been prescribed (as above).  A prescription has been provided for fluticasone nasal spray, one spray per nostril daily as needed. Proper nasal spray technique has been discussed and demonstrated.  Nasal saline spray (i.e. Simply Saline) is recommended prior to medicated nasal sprays and as needed.   Meds ordered this encounter  Medications  . levocetirizine (XYZAL) 5 MG tablet    Sig: Take 1 tablet (5 mg total) by mouth every evening.    Dispense:  30 tablet    Refill:  5  . montelukast (SINGULAIR) 10 MG tablet    Sig: Take 1 tablet (10 mg total) by mouth at bedtime.    Dispense:  30 tablet    Refill:  5  . ammonium lactate (LAC-HYDRIN) 12 % lotion    Sig: Apply twice a day to affected areas as needed.    Dispense:  500 g    Refill:  5  . fluticasone (FLONASE) 50 MCG/ACT nasal spray    Sig: One spray each nostril once a day as needed for nasal congestion or drainage.    Dispense:  16 g    Refill:  5    Diagnostics: Environmental skin testing: Positive to cockroach antigen. Food allergen skin testing: Negative despite a positive histamine control.    Physical examination: Blood pressure 124/74, pulse 64, temperature (!) 97.4 F (36.3 C), temperature source Oral, resp. rate 16, height 5' 1.6"  (1.565 m), weight 131 lb (59.4 kg), SpO2 97 %.  General: Alert, interactive, in no acute distress. HEENT: TMs pearly gray, turbinates mildly edematous without discharge, post-pharynx unremarkable. Neck: Supple without lymphadenopathy. Lungs: Clear to auscultation without wheezing, rhonchi or rales. CV: Normal S1, S2 without murmurs. Abdomen: Nondistended, nontender. Skin: Mildly erythematous patch at the hairline of her posterior neck. Extremities:  No clubbing, cyanosis or edema. Neuro:   Grossly intact.  Review of systems:  Review of systems negative except as noted in HPI / PMHx or noted below: Review of Systems  Constitutional: Negative.   HENT: Negative.   Eyes: Negative.   Respiratory: Negative.   Cardiovascular: Negative.   Gastrointestinal: Negative.   Genitourinary: Negative.   Musculoskeletal: Negative.   Skin: Negative.   Neurological: Negative.   Endo/Heme/Allergies: Negative.   Psychiatric/Behavioral: Negative.     Past medical history:  Past Medical History:  Diagnosis Date  . Urticaria     Past surgical history:  Past Surgical History:  Procedure Laterality Date  . TUBAL LIGATION      Family history: Family History  Problem Relation Age of Onset  . Allergic rhinitis Father   . Allergic rhinitis Daughter   . Food Allergy Daughter        shellfish allergy  . Angioedema Neg Hx   . Asthma Neg Hx   . Eczema Neg Hx   . Immunodeficiency Neg Hx   . Urticaria Neg Hx     Social history: Social History   Socioeconomic History  . Marital status: Married  Spouse name: Not on file  . Number of children: Not on file  . Years of education: Not on file  . Highest education level: Not on file  Occupational History  . Not on file  Social Needs  . Financial resource strain: Not on file  . Food insecurity:    Worry: Not on file    Inability: Not on file  . Transportation needs:    Medical: Not on file    Non-medical: Not on file  Tobacco Use  .  Smoking status: Never Smoker  . Smokeless tobacco: Never Used  Substance and Sexual Activity  . Alcohol use: Yes    Alcohol/week: 6.0 standard drinks    Types: 6 Glasses of wine per week  . Drug use: Never  . Sexual activity: Not on file  Lifestyle  . Physical activity:    Days per week: Not on file    Minutes per session: Not on file  . Stress: Not on file  Relationships  . Social connections:    Talks on phone: Not on file    Gets together: Not on file    Attends religious service: Not on file    Active member of club or organization: Not on file    Attends meetings of clubs or organizations: Not on file    Relationship status: Not on file  . Intimate partner violence:    Fear of current or ex partner: Not on file    Emotionally abused: Not on file    Physically abused: Not on file    Forced sexual activity: Not on file  Other Topics Concern  . Not on file  Social History Narrative  . Not on file   Environmental History: The patient lives in house with hardwood floors throughout and central air/heat.  There is no known mold/water damage in the home.  She is a non-smoker without pets.  Allergies as of 03/04/2018      Reactions   Tramadol Hcl Itching      Medication List        Accurate as of 03/04/18 12:33 PM. Always use your most recent med list.          ADVIL PM PO Take 1 tablet by mouth at bedtime as needed (for sleep).   ammonium lactate 12 % lotion Commonly known as:  LAC-HYDRIN Apply twice a day to affected areas as needed.   atenolol-chlorthalidone 50-25 MG tablet Commonly known as:  TENORETIC TAKE 1 TABLET BY MOUTH EVERY DAY   b complex vitamins tablet Take 1 tablet by mouth daily.   fluticasone 50 MCG/ACT nasal spray Commonly known as:  FLONASE One spray each nostril once a day as needed for nasal congestion or drainage.   glucosamine-chondroitin 500-400 MG tablet Take by mouth.   ibuprofen 200 MG tablet Commonly known as:   ADVIL,MOTRIN Take 200 mg by mouth every 6 (six) hours as needed.   levocetirizine 5 MG tablet Commonly known as:  XYZAL Take 1 tablet (5 mg total) by mouth every evening.   MELATONIN PO Take 1 tablet by mouth at bedtime as needed (for sleep).   montelukast 10 MG tablet Commonly known as:  SINGULAIR Take 1 tablet (10 mg total) by mouth at bedtime.   PROBIOTIC DAILY PO Take by mouth.   simvastatin 10 MG tablet Commonly known as:  ZOCOR Take 10 mg by mouth daily.   traMADol 50 MG tablet Commonly known as:  ULTRAM Take by mouth every 6 (six)  hours as needed.   TURMERIC PO Take by mouth.   valACYclovir 500 MG tablet Commonly known as:  VALTREX Take 500 mg by mouth 2 (two) times daily.   Vitamin D-3 1000 units Caps Take by mouth.       Known medication allergies: Allergies  Allergen Reactions  . Tramadol Hcl Itching    I appreciate the opportunity to take part in Jennifer Bryan's care. Please do not hesitate to contact me with questions.  Sincerely,   R. Edgar Frisk, MD

## 2018-03-07 LAB — H. PYLORI BREATH TEST: H pylori Breath Test: NEGATIVE

## 2018-03-10 LAB — CHRONIC URTICARIA: cu index: 4.5 (ref <10)

## 2018-03-10 LAB — ALPHA-GAL PANEL
Alpha Gal IgE*: <0.1 kU/L (ref <0.10)
Beef (Bos spp) IgE: <0.1 kU/L (ref <0.35)
Class Interpretation: 0
Class Interpretation: 0
Class Interpretation: 0
Lamb/Mutton (Ovis spp) IgE: <0.1 kU/L (ref <0.35)
Pork (Sus spp) IgE: <0.1 kU/L (ref <0.35)

## 2018-03-10 LAB — THYROGLOBULIN ANTIBODY: Thyroglobulin Antibody: <1 IU/mL (ref 0.0–0.9)

## 2018-03-10 LAB — THYROID PEROXIDASE ANTIBODY: Thyroperoxidase Ab SerPl-aCnc: 10 IU/mL (ref 0–34)

## 2018-03-10 LAB — TRYPTASE: Tryptase: 4.1 ug/L (ref 2.2–13.2)

## 2018-03-11 ENCOUNTER — Telehealth: Payer: Self-pay | Admitting: Allergy and Immunology

## 2018-03-11 NOTE — Telephone Encounter (Signed)
Returning a nurse phone call [phone]

## 2018-03-11 NOTE — Telephone Encounter (Signed)
See lab result note.

## 2018-04-27 ENCOUNTER — Telehealth: Payer: Self-pay

## 2018-04-27 NOTE — Telephone Encounter (Signed)
Letter writen per dr Verlin Fester

## 2018-04-29 NOTE — Telephone Encounter (Signed)
Lm for pt to pick up letter

## 2018-09-03 ENCOUNTER — Other Ambulatory Visit: Payer: Self-pay

## 2018-09-03 MED ORDER — MONTELUKAST SODIUM 10 MG PO TABS: 10.0000 mg | ORAL_TABLET | Freq: Every day | ORAL | 5 refills | Status: AC

## 2018-09-03 MED ORDER — LEVOCETIRIZINE DIHYDROCHLORIDE 5 MG PO TABS: 5.0000 mg | ORAL_TABLET | Freq: Every evening | ORAL | 5 refills | Status: AC
